# Patient Record
Sex: Male | Born: 1970 | Race: Black or African American | Hispanic: No | Marital: Single | Smoking: Never smoker
Health system: Southern US, Community
[De-identification: ages and names within clinical notes are randomized; demographics above are authoritative.]

## PROBLEM LIST (undated history)

## (undated) DIAGNOSIS — I10 Essential (primary) hypertension: Secondary | ICD-10-CM

## (undated) DIAGNOSIS — E785 Hyperlipidemia, unspecified: Secondary | ICD-10-CM

## (undated) DIAGNOSIS — E669 Obesity, unspecified: Secondary | ICD-10-CM

## (undated) DIAGNOSIS — I2510 Atherosclerotic heart disease of native coronary artery without angina pectoris: Secondary | ICD-10-CM

## (undated) DIAGNOSIS — E119 Type 2 diabetes mellitus without complications: Secondary | ICD-10-CM

## (undated) DIAGNOSIS — K5792 Diverticulitis of intestine, part unspecified, without perforation or abscess without bleeding: Secondary | ICD-10-CM

## (undated) DIAGNOSIS — K219 Gastro-esophageal reflux disease without esophagitis: Secondary | ICD-10-CM

## (undated) HISTORY — PX: ROTATOR CUFF REPAIR: SHX139

## (undated) HISTORY — PX: OTHER SURGICAL HISTORY: SHX169

## (undated) HISTORY — PX: COSMETIC SURGERY: SHX468

---

## 2011-02-21 ENCOUNTER — Emergency Department (INDEPENDENT_AMBULATORY_CARE_PROVIDER_SITE_OTHER): Payer: BC Managed Care – PPO

## 2011-02-21 ENCOUNTER — Emergency Department (HOSPITAL_BASED_OUTPATIENT_CLINIC_OR_DEPARTMENT_OTHER)
Admission: EM | Admit: 2011-02-21 | Discharge: 2011-02-21 | Disposition: A | Discharge status: Home / Self Care | Payer: BC Managed Care – PPO | Attending: Emergency Medicine | Admitting: Emergency Medicine

## 2011-02-21 DIAGNOSIS — M79609 Pain in unspecified limb: Secondary | ICD-10-CM

## 2011-02-21 DIAGNOSIS — M25549 Pain in joints of unspecified hand: Secondary | ICD-10-CM

## 2011-02-21 DIAGNOSIS — S52599A Other fractures of lower end of unspecified radius, initial encounter for closed fracture: Secondary | ICD-10-CM

## 2011-02-21 DIAGNOSIS — W010XXA Fall on same level from slipping, tripping and stumbling without subsequent striking against object, initial encounter: Secondary | ICD-10-CM

## 2011-02-21 DIAGNOSIS — M25539 Pain in unspecified wrist: Secondary | ICD-10-CM

## 2011-02-21 DIAGNOSIS — M25579 Pain in unspecified ankle and joints of unspecified foot: Secondary | ICD-10-CM | POA: Insufficient documentation

## 2011-02-21 DIAGNOSIS — W19XXXA Unspecified fall, initial encounter: Secondary | ICD-10-CM | POA: Insufficient documentation

## 2011-02-21 HISTORY — DX: Obesity, unspecified: E66.9

## 2011-02-21 HISTORY — DX: Gastro-esophageal reflux disease without esophagitis: K21.9

## 2011-02-21 HISTORY — DX: Essential (primary) hypertension: I10

## 2011-02-21 NOTE — ED Notes (Signed)
Pt states that he fell tonight at home and that he injured his R ankle and R hand.  Pt c/o pain in the R hand at the 4th and 5th digits

## 2011-09-08 ENCOUNTER — Other Ambulatory Visit: Payer: Self-pay

## 2011-09-08 ENCOUNTER — Emergency Department (HOSPITAL_BASED_OUTPATIENT_CLINIC_OR_DEPARTMENT_OTHER)
Admission: EM | Admit: 2011-09-08 | Discharge: 2011-09-09 | Disposition: A | Discharge status: Home / Self Care | Payer: BC Managed Care – PPO | Attending: Emergency Medicine | Admitting: Emergency Medicine

## 2011-09-08 ENCOUNTER — Emergency Department (INDEPENDENT_AMBULATORY_CARE_PROVIDER_SITE_OTHER): Payer: BC Managed Care – PPO

## 2011-09-08 ENCOUNTER — Encounter (HOSPITAL_BASED_OUTPATIENT_CLINIC_OR_DEPARTMENT_OTHER): Payer: Self-pay | Admitting: Emergency Medicine

## 2011-09-08 DIAGNOSIS — R141 Gas pain: Secondary | ICD-10-CM

## 2011-09-08 DIAGNOSIS — K219 Gastro-esophageal reflux disease without esophagitis: Secondary | ICD-10-CM | POA: Insufficient documentation

## 2011-09-08 DIAGNOSIS — R142 Eructation: Secondary | ICD-10-CM | POA: Insufficient documentation

## 2011-09-08 DIAGNOSIS — E669 Obesity, unspecified: Secondary | ICD-10-CM | POA: Insufficient documentation

## 2011-09-08 DIAGNOSIS — E785 Hyperlipidemia, unspecified: Secondary | ICD-10-CM | POA: Insufficient documentation

## 2011-09-08 DIAGNOSIS — K2970 Gastritis, unspecified, without bleeding: Secondary | ICD-10-CM

## 2011-09-08 DIAGNOSIS — R079 Chest pain, unspecified: Secondary | ICD-10-CM

## 2011-09-08 DIAGNOSIS — I10 Essential (primary) hypertension: Secondary | ICD-10-CM | POA: Insufficient documentation

## 2011-09-08 DIAGNOSIS — R110 Nausea: Secondary | ICD-10-CM | POA: Insufficient documentation

## 2011-09-08 DIAGNOSIS — Z79899 Other long term (current) drug therapy: Secondary | ICD-10-CM | POA: Insufficient documentation

## 2011-09-08 DIAGNOSIS — R109 Unspecified abdominal pain: Secondary | ICD-10-CM | POA: Insufficient documentation

## 2011-09-08 HISTORY — DX: Hyperlipidemia, unspecified: E78.5

## 2011-09-08 LAB — DIFFERENTIAL
Basophils Absolute: 0 10*3/uL (ref 0.0–0.1)
Basophils Relative: 0 % (ref 0–1)
Lymphocytes Relative: 51 % — ABNORMAL HIGH (ref 12–46)
Neutro Abs: 2.8 10*3/uL (ref 1.7–7.7)
Neutrophils Relative %: 39 % — ABNORMAL LOW (ref 43–77)

## 2011-09-08 LAB — CBC
MCHC: 34 g/dL (ref 30.0–36.0)
RDW: 12.1 % (ref 11.5–15.5)
WBC: 7.1 10*3/uL (ref 4.0–10.5)

## 2011-09-08 LAB — HEPATIC FUNCTION PANEL
AST: 29 U/L (ref 0–37)
Albumin: 4 g/dL (ref 3.5–5.2)
Bilirubin, Direct: <0.1 mg/dL (ref 0.0–0.3)
Total Protein: 8.5 g/dL — ABNORMAL HIGH (ref 6.0–8.3)

## 2011-09-08 LAB — BASIC METABOLIC PANEL
Chloride: 93 mEq/L — ABNORMAL LOW (ref 96–112)
GFR calc Af Amer: >90 mL/min (ref >90)
Potassium: 3.7 mEq/L (ref 3.5–5.1)
Sodium: 133 mEq/L — ABNORMAL LOW (ref 135–145)

## 2011-09-08 LAB — TROPONIN I: Troponin I: <0.3 ng/mL (ref <0.30)

## 2011-09-08 MED ORDER — METOCLOPRAMIDE HCL 5 MG/ML IJ SOLN: 10.0000 mg | Freq: Once | INTRAMUSCULAR | Status: DC

## 2011-09-08 MED ORDER — METOCLOPRAMIDE HCL 5 MG/ML IJ SOLN
10.0000 mg | Freq: Once | INTRAMUSCULAR | Status: AC
  Administered 2011-09-09: 10 mg via INTRAMUSCULAR
  Filled 2011-09-08: qty 2

## 2011-09-08 MED ORDER — GI COCKTAIL ~~LOC~~
30.0000 mL | Freq: Once | ORAL | Status: AC
  Administered 2011-09-08: 30 mL via ORAL
  Filled 2011-09-08: qty 30

## 2011-09-08 MED ORDER — METOCLOPRAMIDE HCL 10 MG PO TABS: 10.0000 mg | ORAL_TABLET | Freq: Four times a day (QID) | ORAL | Status: AC

## 2011-09-08 NOTE — ED Notes (Signed)
Pt updated on POC and is waiting to speak with EDP

## 2011-09-08 NOTE — ED Notes (Signed)
States he has been belching constantly x 3 days. Able to drink water and eat food without feeling of obstruction. Hx of having his esophagus stretched 7 years ago.

## 2011-09-08 NOTE — ED Provider Notes (Signed)
History     CSN: [id number]  Arrival date & time 09/08/11  1955   First MD Initiated Contact with Patient 09/08/11 2124      Chief Complaint  Patient presents with  . Nausea  . GI Problem  . Abdominal Pain    (Consider location/radiation/quality/duration/timing/severity/associated sxs/prior treatment) HPI Comments: Pt states that he has been persistently belching for 4 days:pt states that he had cp for a couple of hours 3 days ago, but has had no pain since:pt states that he is having nausea:pt denies abdominal or cp at this time  Patient is a 41 y.o. male presenting with GI illlness. The history is provided by the patient. No language interpreter was used.  GI Problem  This is a new problem. The current episode started more than 2 days ago. The problem occurs continuously. The problem has not changed since onset.There has been no fever. Pertinent negatives include no abdominal pain, no vomiting and no cough. He has tried nothing for the symptoms.    Past Medical History  Diagnosis Date  . Obesity   . Hypertension   . GERD (gastroesophageal reflux disease)   . Hyperlipemia     Past Surgical History  Procedure Date  . Cosmetic eye surgery     No family history on file.  History  Substance Use Topics  . Smoking status: Never Smoker   . Smokeless tobacco: Not on file  . Alcohol Use: No      Review of Systems  Respiratory: Negative for cough.   Gastrointestinal: Negative for vomiting and abdominal pain.  All other systems reviewed and are negative.    Allergies  Review of patient's allergies indicates no known allergies.  Home Medications   Current Outpatient Rx  Name Route Sig Dispense Refill  . ESOMEPRAZOLE MAGNESIUM 40 MG PO CPDR Oral Take 40 mg by mouth daily before breakfast.      . LISINOPRIL-HYDROCHLOROTHIAZIDE 20-12.5 MG PO TABS Oral Take 1 tablet by mouth daily.    Marland Kitchen LOVASTATIN 40 MG PO TABS Oral Take 40 mg by mouth at bedtime.    Marland Kitchen METFORMIN HCL  500 MG PO TABS Oral Take 500 mg by mouth daily.    Marland Kitchen METOPROLOL TARTRATE 100 MG PO TABS Oral Take 200 mg by mouth daily.    Marland Kitchen RANITIDINE HCL 150 MG PO TABS Oral Take 150 mg by mouth once.      BP 138/110  Pulse 108  Temp(Src) 98.4 F (36.9 C) (Oral)  Resp 18  SpO2 97%  Physical Exam  Nursing note reviewed. Constitutional: He is oriented to person, place, and time. He appears well-developed and well-nourished. He appears distressed.  HENT:  Head: Normocephalic and atraumatic.  Eyes: EOM are normal.  Neck: Neck supple.  Cardiovascular: Normal rate and regular rhythm.   Pulmonary/Chest: Effort normal and breath sounds normal.  Abdominal: Soft. Bowel sounds are normal. There is no tenderness.  Musculoskeletal: Normal range of motion.  Neurological: He is alert and oriented to person, place, and time.  Skin: Skin is warm. He is diaphoretic.  Psychiatric: He has a normal mood and affect.    ED Course  Procedures (including critical care time)   Labs Reviewed  CBC  DIFFERENTIAL  BASIC METABOLIC PANEL  TROPONIN I   No results found.   No diagnosis found.    MDM   Date: 09/08/2011  Rate: 106  Rhythm: sinus tachycardia  QRS Axis: normal  Intervals: normal  ST/T Wave abnormalities: nonspecific ST changes  Conduction Disutrbances:none  Narrative Interpretation:   Old EKG Reviewed: none available  Results for orders placed during the hospital encounter of 09/08/11  CBC      Component Value Range   WBC 7.1  4.0 - 10.5 (K/uL)   RBC 4.85  4.22 - 5.81 (MIL/uL)   Hemoglobin 14.6  13.0 - 17.0 (g/dL)   HCT 16.1  09.6 - 04.5 (%)   MCV 88.5  78.0 - 100.0 (fL)   MCH 30.1  26.0 - 34.0 (pg)   MCHC 34.0  30.0 - 36.0 (g/dL)   RDW 40.9  81.1 - 91.4 (%)   Platelets 227  150 - 400 (K/uL)  DIFFERENTIAL      Component Value Range   Neutrophils Relative 39 (*) 43 - 77 (%)   Neutro Abs 2.8  1.7 - 7.7 (K/uL)   Lymphocytes Relative 51 (*) 12 - 46 (%)   Lymphs Abs 3.6  0.7 - 4.0  (K/uL)   Monocytes Relative 9  3 - 12 (%)   Monocytes Absolute 0.6  0.1 - 1.0 (K/uL)   Eosinophils Relative 2  0 - 5 (%)   Eosinophils Absolute 0.1  0.0 - 0.7 (K/uL)   Basophils Relative 0  0 - 1 (%)   Basophils Absolute 0.0  0.0 - 0.1 (K/uL)  BASIC METABOLIC PANEL      Component Value Range   Sodium 133 (*) 135 - 145 (mEq/L)   Potassium 3.7  3.5 - 5.1 (mEq/L)   Chloride 93 (*) 96 - 112 (mEq/L)   CO2 22  19 - 32 (mEq/L)   Glucose, Bld 296 (*) 70 - 99 (mg/dL)   BUN 12  6 - 23 (mg/dL)   Creatinine, Ser 7.82  0.50 - 1.35 (mg/dL)   Calcium 9.9  8.4 - 95.6 (mg/dL)   GFR calc non Af Amer >90  >90 (mL/min)   GFR calc Af Amer >90  >90 (mL/min)  TROPONIN I      Component Value Range   Troponin I <0.30  <0.30 (ng/mL)  HEPATIC FUNCTION PANEL      Component Value Range   Total Protein 8.5 (*) 6.0 - 8.3 (g/dL)   Albumin 4.0  3.5 - 5.2 (g/dL)   AST 29  0 - 37 (U/L)   ALT 24  0 - 53 (U/L)   Alkaline Phosphatase 56  39 - 117 (U/L)   Total Bilirubin 0.3  0.3 - 1.2 (mg/dL)   Bilirubin, Direct <2.1  0.0 - 0.3 (mg/dL)   Indirect Bilirubin NOT CALCULATED  0.3 - 0.9 (mg/dL)   Dg Chest 2 View  10/22/6576  *RADIOLOGY REPORT*  Clinical Data: Uncontrollable belching; mid anterior chest pain.  CHEST - 2 VIEW  Comparison: None.  Findings: The lungs are well-aerated and clear.  There is no evidence of focal opacification, pleural effusion or pneumothorax. Pulmonary vascularity is at the upper limits of normal.  The heart is normal in size; the mediastinal contour is within normal limits.  No acute osseous abnormalities are seen.  IMPRESSION: No acute cardiopulmonary process seen.  Original Report Authenticated By: Tonia Ghent, M.D.    11:27 PM Patient is reassessed in the room.  Diagnostic studies are reviewed. Normal liver function tests, normal bilirubin. Electrolytes essentially normal. CBC is normal. Chest x-ray is normal. Troponin is negative.  Give Reglan IV for his persistent burping. Other  studies are normal. Patient is in no acute distress. He will likely need further evaluation as an outpatient by a GI  specialist, will refer to Dr. Madilyn Fireman. Patient was told to return to the ED for any concerns or changing symptoms          Hayat Warbington A. Patrica Duel, MD 09/08/11 2337

## 2011-09-08 NOTE — ED Notes (Signed)
Pt c/o generalized abd pain with nausea and excessive burping since sat.

## 2014-12-04 ENCOUNTER — Emergency Department (HOSPITAL_BASED_OUTPATIENT_CLINIC_OR_DEPARTMENT_OTHER): Payer: BC Managed Care – PPO

## 2014-12-04 ENCOUNTER — Emergency Department (HOSPITAL_BASED_OUTPATIENT_CLINIC_OR_DEPARTMENT_OTHER)
Admission: EM | Admit: 2014-12-04 | Discharge: 2014-12-04 | Disposition: A | Discharge status: Home / Self Care | Payer: BC Managed Care – PPO | Attending: Emergency Medicine | Admitting: Emergency Medicine

## 2014-12-04 ENCOUNTER — Encounter (HOSPITAL_BASED_OUTPATIENT_CLINIC_OR_DEPARTMENT_OTHER): Payer: Self-pay

## 2014-12-04 DIAGNOSIS — R739 Hyperglycemia, unspecified: Secondary | ICD-10-CM

## 2014-12-04 DIAGNOSIS — K219 Gastro-esophageal reflux disease without esophagitis: Secondary | ICD-10-CM | POA: Diagnosis not present

## 2014-12-04 DIAGNOSIS — E1165 Type 2 diabetes mellitus with hyperglycemia: Secondary | ICD-10-CM | POA: Diagnosis not present

## 2014-12-04 DIAGNOSIS — R1012 Left upper quadrant pain: Secondary | ICD-10-CM

## 2014-12-04 DIAGNOSIS — K5792 Diverticulitis of intestine, part unspecified, without perforation or abscess without bleeding: Secondary | ICD-10-CM | POA: Insufficient documentation

## 2014-12-04 DIAGNOSIS — I10 Essential (primary) hypertension: Secondary | ICD-10-CM | POA: Insufficient documentation

## 2014-12-04 DIAGNOSIS — Z79899 Other long term (current) drug therapy: Secondary | ICD-10-CM | POA: Insufficient documentation

## 2014-12-04 DIAGNOSIS — R1032 Left lower quadrant pain: Secondary | ICD-10-CM | POA: Diagnosis present

## 2014-12-04 DIAGNOSIS — E785 Hyperlipidemia, unspecified: Secondary | ICD-10-CM | POA: Insufficient documentation

## 2014-12-04 DIAGNOSIS — E669 Obesity, unspecified: Secondary | ICD-10-CM | POA: Diagnosis not present

## 2014-12-04 HISTORY — DX: Diverticulitis of intestine, part unspecified, without perforation or abscess without bleeding: K57.92

## 2014-12-04 HISTORY — DX: Type 2 diabetes mellitus without complications: E11.9

## 2014-12-04 LAB — CBC WITH DIFFERENTIAL/PLATELET
BASOS PCT: 0 % (ref 0–1)
Basophils Absolute: 0 10*3/uL (ref 0.0–0.1)
EOS ABS: 0.1 10*3/uL (ref 0.0–0.7)
EOS PCT: 1 % (ref 0–5)
HEMATOCRIT: 39.5 % (ref 39.0–52.0)
Hemoglobin: 13.7 g/dL (ref 13.0–17.0)
Lymphocytes Relative: 32 % (ref 12–46)
Lymphs Abs: 2.5 10*3/uL (ref 0.7–4.0)
MCH: 30.2 pg (ref 26.0–34.0)
MCHC: 34.7 g/dL (ref 30.0–36.0)
MCV: 87.2 fL (ref 78.0–100.0)
MONO ABS: 0.5 10*3/uL (ref 0.1–1.0)
MONOS PCT: 7 % (ref 3–12)
NEUTROS PCT: 60 % (ref 43–77)
Neutro Abs: 4.6 10*3/uL (ref 1.7–7.7)
Platelets: 230 10*3/uL (ref 150–400)
RBC: 4.53 MIL/uL (ref 4.22–5.81)
RDW: 11.9 % (ref 11.5–15.5)
WBC: 7.8 10*3/uL (ref 4.0–10.5)

## 2014-12-04 LAB — COMPREHENSIVE METABOLIC PANEL
ALT: 13 U/L (ref 0–53)
AST: 14 U/L (ref 0–37)
Albumin: 3.5 g/dL (ref 3.5–5.2)
Alkaline Phosphatase: 56 U/L (ref 39–117)
Anion gap: 9 (ref 5–15)
BUN: 10 mg/dL (ref 6–23)
CALCIUM: 8.6 mg/dL (ref 8.4–10.5)
CHLORIDE: 94 mmol/L — AB (ref 96–112)
CO2: 26 mmol/L (ref 19–32)
CREATININE: 1.17 mg/dL (ref 0.50–1.35)
GFR, EST AFRICAN AMERICAN: 87 mL/min — AB (ref >90)
GFR, EST NON AFRICAN AMERICAN: 75 mL/min — AB (ref >90)
Glucose, Bld: 538 mg/dL — ABNORMAL HIGH (ref 70–99)
POTASSIUM: 4.2 mmol/L (ref 3.5–5.1)
SODIUM: 129 mmol/L — AB (ref 135–145)
TOTAL PROTEIN: 7.6 g/dL (ref 6.0–8.3)
Total Bilirubin: 0.4 mg/dL (ref 0.3–1.2)

## 2014-12-04 LAB — URINALYSIS, ROUTINE W REFLEX MICROSCOPIC
BILIRUBIN URINE: NEGATIVE
Glucose, UA: >1000 mg/dL — AB
HGB URINE DIPSTICK: NEGATIVE
KETONES UR: NEGATIVE mg/dL
Leukocytes, UA: NEGATIVE
Nitrite: NEGATIVE
PROTEIN: NEGATIVE mg/dL
Specific Gravity, Urine: 1.031 — ABNORMAL HIGH (ref 1.005–1.030)
UROBILINOGEN UA: 0.2 mg/dL (ref 0.0–1.0)
pH: 6 (ref 5.0–8.0)

## 2014-12-04 LAB — LIPASE, BLOOD: LIPASE: 39 U/L (ref 11–59)

## 2014-12-04 LAB — URINE MICROSCOPIC-ADD ON

## 2014-12-04 LAB — CBG MONITORING, ED
GLUCOSE-CAPILLARY: 315 mg/dL — AB (ref 70–99)
Glucose-Capillary: 393 mg/dL — ABNORMAL HIGH (ref 70–99)

## 2014-12-04 MED ORDER — IOHEXOL 300 MG/ML  SOLN
100.0000 mL | Freq: Once | INTRAMUSCULAR | Status: AC | PRN
  Administered 2014-12-04: 100 mL via INTRAVENOUS

## 2014-12-04 MED ORDER — SODIUM CHLORIDE 0.9 % IV BOLUS (SEPSIS)
1000.0000 mL | Freq: Once | INTRAVENOUS | Status: AC
  Administered 2014-12-04: 1000 mL via INTRAVENOUS

## 2014-12-04 MED ORDER — HYDROCODONE-ACETAMINOPHEN 5-325 MG PO TABS: 1.0000 | ORAL_TABLET | Freq: Four times a day (QID) | ORAL | Status: DC | PRN

## 2014-12-04 MED ORDER — METRONIDAZOLE 500 MG PO TABS: 500.0000 mg | ORAL_TABLET | Freq: Three times a day (TID) | ORAL | Status: DC

## 2014-12-04 MED ORDER — CIPROFLOXACIN HCL 500 MG PO TABS
500.0000 mg | ORAL_TABLET | Freq: Once | ORAL | Status: AC
  Administered 2014-12-04: 500 mg via ORAL
  Filled 2014-12-04: qty 1

## 2014-12-04 MED ORDER — IOHEXOL 300 MG/ML  SOLN
50.0000 mL | Freq: Once | INTRAMUSCULAR | Status: AC | PRN
  Administered 2014-12-04: 50 mL via ORAL

## 2014-12-04 MED ORDER — METRONIDAZOLE 500 MG PO TABS
500.0000 mg | ORAL_TABLET | Freq: Once | ORAL | Status: AC
  Administered 2014-12-04: 500 mg via ORAL
  Filled 2014-12-04: qty 1

## 2014-12-04 MED ORDER — CIPROFLOXACIN HCL 500 MG PO TABS: 500.0000 mg | ORAL_TABLET | Freq: Two times a day (BID) | ORAL | Status: DC

## 2014-12-04 NOTE — ED Provider Notes (Signed)
CSN: [id number]     Arrival date & time 12/04/14  1406 History   First MD Initiated Contact with Patient 12/04/14 1504     Chief Complaint  Patient presents with  . Abdominal Pain     (Consider location/radiation/quality/duration/timing/severity/associated sxs/prior Treatment) The history is provided by the patient. No language interpreter was used.  Lindley Hiney is a 44 year old male with past medical history of obesity, diabetes, hypertension, GERD, diverticulitis presenting to the ED with abdominal pain that has been ongoing for approximately one week. Patient reports that the pain is localized to left lower quadrant with radiation to the right lower quadrant describes a throbbing sensation that comes and goes worse with motion. Patient reports he's been having nausea without episodes of emesis. Reported mildly loose stools with no mucus or blood identified. Reported increased urine frequency. Stated that he has not taking anything for the pain. Reported that he's been drinking fluids and staying hydrated. Patient reported that he has history of diverticulitis, last year in the year before that. Patient reported that he had a colonoscopy performed approximate 2 years ago that identified diverticulosis. Denied fever, chills, neck pain, neck stiffness, chest pain, shortness of breath, difficulty breathing, vomiting, diarrhea, melena, hematochezia, hematuria, dysuria. PCP none  Past Medical History  Diagnosis Date  . Obesity   . Hypertension   . GERD (gastroesophageal reflux disease)   . Hyperlipemia   . Diverticulitis   . Diabetes mellitus without complication    Past Surgical History  Procedure Laterality Date  . Cosmetic eye surgery    . Cosmetic surgery    . Nipple surgery     No family history on file. History  Substance Use Topics  . Smoking status: Never Smoker   . Smokeless tobacco: Not on file  . Alcohol Use: No    Review of Systems  Constitutional: Negative  for fever and chills.  Eyes: Negative for visual disturbance.  Respiratory: Negative for chest tightness and shortness of breath.   Cardiovascular: Negative for chest pain.  Gastrointestinal: Positive for nausea, abdominal pain and diarrhea. Negative for vomiting, constipation, blood in stool and anal bleeding.  Genitourinary: Positive for frequency. Negative for dysuria, hematuria and decreased urine volume.  Musculoskeletal: Negative for back pain, neck pain and neck stiffness.  Neurological: Negative for dizziness, weakness and headaches.      Allergies  Review of patient's allergies indicates no known allergies.  Home Medications   Prior to Admission medications   Medication Sig Start Date End Date Taking? Authorizing Provider  LOSARTAN POTASSIUM-HCTZ PO Take by mouth.   Yes Historical Provider, MD  ciprofloxacin (CIPRO) 500 MG tablet Take 1 tablet (500 mg total) by mouth 2 (two) times daily. 12/04/14   Geriann Lafont, PA-C  esomeprazole (NEXIUM) 40 MG capsule Take 40 mg by mouth daily before breakfast.      Historical Provider, MD  HYDROcodone-acetaminophen (NORCO/VICODIN) 5-325 MG per tablet Take 1 tablet by mouth every 6 (six) hours as needed for severe pain. 12/04/14   Deneen Slager, PA-C  lovastatin (MEVACOR) 40 MG tablet Take 40 mg by mouth at bedtime.    Historical Provider, MD  metFORMIN (GLUCOPHAGE) 500 MG tablet Take 500 mg by mouth daily.    Historical Provider, MD  metroNIDAZOLE (FLAGYL) 500 MG tablet Take 1 tablet (500 mg total) by mouth 3 (three) times daily. 12/04/14   Zvi Duplantis, PA-C   BP 134/82 mmHg  Pulse 86  Temp(Src) 98.2 F (36.8 C) (Oral)  Resp 18  Ht 6\' 1"  (1.854 m)  Wt 309 lb (140.161 kg)  BMI 40.78 kg/m2  SpO2 94% Physical Exam  Constitutional: He is oriented to person, place, and time. He appears well-developed and well-nourished. No distress.  HENT:  Head: Normocephalic and atraumatic.  Mouth/Throat: Oropharynx is clear and moist. No  oropharyngeal exudate.  Eyes: Conjunctivae and EOM are normal. Right eye exhibits no discharge. Left eye exhibits no discharge.  Neck: Normal range of motion. Neck supple.  Cardiovascular: Normal rate, regular rhythm and normal heart sounds.  Exam reveals no friction rub.   No murmur heard. Pulmonary/Chest: Effort normal and breath sounds normal. No respiratory distress. He has no wheezes. He has no rales.  Abdominal: Soft. Bowel sounds are normal. He exhibits no distension. There is tenderness in the right lower quadrant and left lower quadrant. There is no rebound and no guarding.  Obese  Musculoskeletal: Normal range of motion.  Neurological: He is alert and oriented to person, place, and time. No cranial nerve deficit. He exhibits normal muscle tone. Coordination normal.  Skin: Skin is warm. No rash noted. He is not diaphoretic. No erythema.  Psychiatric: He has a normal mood and affect. His behavior is normal. Thought content normal.  Nursing note and vitals reviewed.   ED Course  Procedures (including critical care time)  Results for orders placed or performed during the hospital encounter of 12/04/14  CBC with Differential/Platelet  Result Value Ref Range   WBC 7.8 4.0 - 10.5 K/uL   RBC 4.53 4.22 - 5.81 MIL/uL   Hemoglobin 13.7 13.0 - 17.0 g/dL   HCT 45.4 09.8 - 11.9 %   MCV 87.2 78.0 - 100.0 fL   MCH 30.2 26.0 - 34.0 pg   MCHC 34.7 30.0 - 36.0 g/dL   RDW 14.7 82.9 - 56.2 %   Platelets 230 150 - 400 K/uL   Neutrophils Relative % 60 43 - 77 %   Neutro Abs 4.6 1.7 - 7.7 K/uL   Lymphocytes Relative 32 12 - 46 %   Lymphs Abs 2.5 0.7 - 4.0 K/uL   Monocytes Relative 7 3 - 12 %   Monocytes Absolute 0.5 0.1 - 1.0 K/uL   Eosinophils Relative 1 0 - 5 %   Eosinophils Absolute 0.1 0.0 - 0.7 K/uL   Basophils Relative 0 0 - 1 %   Basophils Absolute 0.0 0.0 - 0.1 K/uL  Comprehensive metabolic panel  Result Value Ref Range   Sodium 129 (L) 135 - 145 mmol/L   Potassium 4.2 3.5 - 5.1  mmol/L   Chloride 94 (L) 96 - 112 mmol/L   CO2 26 19 - 32 mmol/L   Glucose, Bld 538 (H) 70 - 99 mg/dL   BUN 10 6 - 23 mg/dL   Creatinine, Ser 1.30 0.50 - 1.35 mg/dL   Calcium 8.6 8.4 - 86.5 mg/dL   Total Protein 7.6 6.0 - 8.3 g/dL   Albumin 3.5 3.5 - 5.2 g/dL   AST 14 0 - 37 U/L   ALT 13 0 - 53 U/L   Alkaline Phosphatase 56 39 - 117 U/L   Total Bilirubin 0.4 0.3 - 1.2 mg/dL   GFR calc non Af Amer 75 (L) >90 mL/min   GFR calc Af Amer 87 (L) >90 mL/min   Anion gap 9 5 - 15  Lipase, blood  Result Value Ref Range   Lipase 39 11 - 59 U/L  Urinalysis, Routine w reflex microscopic  Result Value Ref Range   Color,  Urine YELLOW YELLOW   APPearance CLEAR CLEAR   Specific Gravity, Urine 1.031 (H) 1.005 - 1.030   pH 6.0 5.0 - 8.0   Glucose, UA >1000 (A) NEGATIVE mg/dL   Hgb urine dipstick NEGATIVE NEGATIVE   Bilirubin Urine NEGATIVE NEGATIVE   Ketones, ur NEGATIVE NEGATIVE mg/dL   Protein, ur NEGATIVE NEGATIVE mg/dL   Urobilinogen, UA 0.2 0.0 - 1.0 mg/dL   Nitrite NEGATIVE NEGATIVE   Leukocytes, UA NEGATIVE NEGATIVE  Urine microscopic-add on  Result Value Ref Range   Squamous Epithelial / LPF RARE RARE   Bacteria, UA RARE RARE  CBG monitoring, ED  Result Value Ref Range   Glucose-Capillary 393 (H) 70 - 99 mg/dL  CBG monitoring, ED  Result Value Ref Range   Glucose-Capillary 315 (H) 70 - 99 mg/dL    Labs Review Labs Reviewed  COMPREHENSIVE METABOLIC PANEL - Abnormal; Notable for the following:    Sodium 129 (*)    Chloride 94 (*)    Glucose, Bld 538 (*)    GFR calc non Af Amer 75 (*)    GFR calc Af Amer 87 (*)    All other components within normal limits  URINALYSIS, ROUTINE W REFLEX MICROSCOPIC - Abnormal; Notable for the following:    Specific Gravity, Urine 1.031 (*)    Glucose, UA >1000 (*)    All other components within normal limits  CBG MONITORING, ED - Abnormal; Notable for the following:    Glucose-Capillary 393 (*)    All other components within normal limits   CBG MONITORING, ED - Abnormal; Notable for the following:    Glucose-Capillary 315 (*)    All other components within normal limits  CBC WITH DIFFERENTIAL/PLATELET  LIPASE, BLOOD  URINE MICROSCOPIC-ADD ON    Imaging Review Ct Abdomen Pelvis W Contrast  12/04/2014   CLINICAL DATA:  Lower abdominal pain, 1 week duration. Nausea. History of diverticulitis.  EXAM: CT ABDOMEN AND PELVIS WITH CONTRAST  TECHNIQUE: Multidetector CT imaging of the abdomen and pelvis was performed using the standard protocol following bolus administration of intravenous contrast.  CONTRAST:  50mL OMNIPAQUE IOHEXOL 300 MG/ML SOLN, 100mL OMNIPAQUE IOHEXOL 300 MG/ML SOLN  COMPARISON:  01/13/2014  FINDINGS: Lung bases are clear.  No pleural or pericardial fluid.  There is diffuse fatty change of the liver. No calcified gallstones. The spleen is normal. The pancreas is normal. The adrenal glands are normal. The kidneys are normal. No cyst, mass, stone or hydronephrosis. The aorta and IVC are normal. No retroperitoneal mass or adenopathy.  The patient has extensive diverticulosis of the colon. There is considerable stranding of the fat adjacent to the descending sigmoid junction consistent with acute diverticulitis. No evidence of drainable abscess or free air. No fluid in the pelvis. Bladder, prostate gland and seminal vesicles are normal.  IMPRESSION: Acute diverticulitis at the descending sigmoid junction. Inflammatory stranding in the region but no drainable abscess at this time.  Fatty liver   Electronically Signed   By: Paulina FusiMark  Shogry M.D.   On: 12/04/2014 19:22     EKG Interpretation None       8:53 PM Patient reassessed. Patient reports that he is feeling better. Patient tolerated antibiotics without difficulty. Negative episodes of emesis in ED setting. Patient reports that he is ready to be discharged home.  MDM   Final diagnoses:  Acute diverticulitis  Left upper quadrant pain  Hyperglycemia    Medications   sodium chloride 0.9 % bolus 1,000 mL (0 mLs Intravenous  Stopped 12/04/14 1632)  iohexol (OMNIPAQUE) 300 MG/ML solution 50 mL (50 mLs Oral Contrast Given 12/04/14 1611)  sodium chloride 0.9 % bolus 1,000 mL (0 mLs Intravenous Stopped 12/04/14 1908)  iohexol (OMNIPAQUE) 300 MG/ML solution 100 mL (100 mLs Intravenous Contrast Given 12/04/14 1902)  ciprofloxacin (CIPRO) tablet 500 mg (500 mg Oral Given 12/04/14 1941)  metroNIDAZOLE (FLAGYL) tablet 500 mg (500 mg Oral Given 12/04/14 1942)    Filed Vitals:   12/04/14 1410 12/04/14 1615 12/04/14 1740 12/04/14 1946  BP: 132/91 124/88 127/78 134/82  Pulse: 116 112 88 86  Temp: 98.2 F (36.8 C)     TempSrc: Oral     Resp: Height:  (1.854 m)     Weight: 309 lb (140.161 kg)     SpO2: 94% 97% 97% 94%   CBC negative elevated leukocytosis. Hemoglobin 13.7, hematocrit 39.5. CMP noted mildly low sodium of 129 with a chloride of 94. Glucose of 538 with negative elevated anion gap-9.0 mEq per liter. Lipase negative elevation. Urinalysis negative for hemoglobin, nitrites, leukocytes- elevated specific gravity of 1.031. Negative ketones noted in urine. CT abdomen and pelvis with contrast identified acute diverticulitis at the descending sigmoid junction-inflammatory stranding in the region but no drainable abscess at this time. Patient given IV fluids in ED setting. After 1 L of IV fluids sugar has decreased from 538 to 393. After 2 L of fluid patient's sugar decreased to 315-patient reported that he did not take his diabetic medications today. Doubt DKA - patient appeared to be mildly dehydrated. CT abdomen pelvis with contrast identified acute diverticulitis without findings of abscess. Patient given IV fluids in ED setting as well as antibiotics by mouth-negative episodes of emesis in ED setting. Patient stable, afebrile. Patient not septic appearing. Negative signs of respiratory distress. Discussed case, labs, and imaging in great detail with  attending physician who agrees to plan of discharge. Discharged patient. Discharge patient with antibiotics and pain medications. Discussed with patient to rest and stay hydrated. Discussed the patient clear liquid diet. [facility] and [facility]. Discussed with patient that sodium levels were a little bit low today, recommended sodium to be rechecked with an approximately 48 hours. Discussed with patient to closely monitor symptoms and if symptoms are to worsen or change to report back to the ED - strict return instructions given.  Patient agreed to plan of care, understood, all questions answered.   Raymon Mutton, PA-C 12/04/14 1610  Rolan Bucco, MD 12/05/14 [id number]

## 2014-12-04 NOTE — Discharge Instructions (Signed)
Please call your doctor for a followup appointment within 24-48 hours. When you talk to your doctor please let them know that you were seen in the emergency department and have them acquire all of your records so that they can discuss the findings with you and formulate a treatment plan to fully care for your new and ongoing problems. Please follow-up and set up an appointment with health and wellness center-[facility] needs to be rechecked with an approximately 24-48 hours Please follow-up with gastroenterology regarding recurrent episodes of diverticulitis Please take antibiotics as prescribed Please take medications as prescribed - while on pain medications there is to be no drinking alcohol, driving, operating any heavy machinery. If extra please dispose in a proper manner. Please do not take any extra Tylenol with this medication for this can lead to Tylenol overdose and liver issues.  Please stick with a clear liquid diet for bowel rest  Please continue to monitor sugars closely - sugar were high today  Please continue to monitor symptoms closely and if symptoms are to worsen or change (fever greater than 101, chills, sweating, nausea, vomiting, chest pain, shortness of breathe, difficulty breathing, weakness, numbness, tingling, worsening or changes to pain pattern, blood in the stools, black tarry stools, inability to keep food or fluids down, worsening or changes to stomach pain, back pain) please report back to the Emergency Department immediately.    Diverticulitis Diverticulitis is inflammation or infection of small pouches in your colon that form when you have a condition called diverticulosis. The pouches in your colon are called diverticula. Your colon, or large intestine, is where water is absorbed and stool is formed. Complications of diverticulitis can include:  Bleeding.  Severe infection.  Severe pain.  Perforation of your colon.  Obstruction of your colon. CAUSES   Diverticulitis is caused by bacteria. Diverticulitis happens when stool becomes trapped in diverticula. This allows bacteria to grow in the diverticula, which can lead to inflammation and infection. RISK FACTORS People with diverticulosis are at risk for diverticulitis. Eating a diet that does not include enough fiber from fruits and vegetables may make diverticulitis more likely to develop. SYMPTOMS  Symptoms of diverticulitis may include:  Abdominal pain and tenderness. The pain is normally located on the left side of the abdomen, but may occur in other areas.  Fever and chills.  Bloating.  Cramping.  Nausea.  Vomiting.  Constipation.  Diarrhea.  Blood in your stool. DIAGNOSIS  Your health care provider will ask you about your medical history and do a physical exam. You may need to have tests done because many medical conditions can cause the same symptoms as diverticulitis. Tests may include:  Blood tests.  Urine tests.  Imaging tests of the abdomen, including X-rays and CT scans. When your condition is under control, your health care provider may recommend that you have a colonoscopy. A colonoscopy can show how severe your diverticula are and whether something else is causing your symptoms. TREATMENT  Most cases of diverticulitis are mild and can be treated at home. Treatment may include:  Taking over-the-counter pain medicines.  Following a clear liquid diet.  Taking antibiotic medicines by mouth for 7-10 days. More severe cases may be treated at a hospital. Treatment may include:  Not eating or drinking.  Taking prescription pain medicine.  Receiving antibiotic medicines through an IV tube.  Receiving fluids and nutrition through an IV tube.  Surgery. HOME CARE INSTRUCTIONS   Follow your health care provider's instructions carefully.  Follow  a full liquid diet or other diet as directed by your health care provider. After your symptoms improve, your  health care provider may tell you to change your diet. He or she may recommend you eat a high-fiber diet. Fruits and vegetables are good sources of fiber. Fiber makes it easier to pass stool.  Take fiber supplements or probiotics as directed by your health care provider.  Only take medicines as directed by your health care provider.  Keep all your follow-up appointments. SEEK MEDICAL CARE IF:   Your pain does not improve.  You have a hard time eating food.  Your bowel movements do not return to normal. SEEK IMMEDIATE MEDICAL CARE IF:   Your pain becomes worse.  Your symptoms do not get better.  Your symptoms suddenly get worse.  You have a fever.  You have repeated vomiting.  You have bloody or black, tarry stools. MAKE SURE YOU:   Understand these instructions.  Will watch your condition.  Will get help right away if you are not doing well or get worse. Document Released: 05/13/2005 Document Revised: 08/08/2013 Document Reviewed: 06/28/2013 [facility] Patient Information 2015 [place], [place]. This information is not intended to replace advice given to you by your health care provider. Make sure you discuss any questions you have with your health care provider.  Clear Liquid Diet A clear liquid diet is a short-term diet that is prescribed to provide the necessary fluid and basic energy you need when you can have nothing else. The clear liquid diet consists of liquids or solids that will become liquid at room temperature. You should be able to see through the liquid. There are many reasons that you may be restricted to clear liquids, such as:  When you have a sudden-onset (acute) condition that occurs before or after surgery.  To help your body slowly get adjusted to food again after a long period when you were unable to have food.  Replacement of fluids when you have a diarrheal disease.  When you are going to have certain exams, such as a colonoscopy, in which instruments  are inserted inside your body to look at parts of your digestive system. WHAT CAN I HAVE? A clear liquid diet does not provide all the nutrients you need. It is important to choose a variety of the following items to get as many nutrients as possible:  Vegetable juices that do not have pulp.  Fruit juices and fruit drinks that do not have pulp.  Coffee (regular or decaffeinated), tea, or soda at the discretion of your health care provider.  Clear bouillon, broth, or strained broth-based soups.  High-protein and flavored gelatins.  Sugar or honey.  Ices or frozen ice pops that do not contain milk. If you are not sure whether you can have certain items, you should ask your health care provider. You may also ask your health care provider if there are any other clear liquid options. Document Released: 08/03/2005 Document Revised: 08/08/2013 Document Reviewed: 06/30/2013 [facility] Patient Information 2015 [place], [place]. This information is not intended to replace advice given to you by your health care provider. Make sure you discuss any questions you have with your health care provider.   Emergency Department Resource Guide 1) Find a Doctor and Pay Out of Pocket Although you won't have to find out who is covered by your insurance plan, it is a good idea to ask around and get recommendations. You will then need to call the office and see if the  doctor you have chosen will accept you as a new patient and what types of options they offer for patients who are self-pay. Some doctors offer discounts or will set up payment plans for their patients who do not have insurance, but you will need to ask so you aren't surprised when you get to your appointment.  2) Contact Your Local Health Department Not all health departments have doctors that can see patients for sick visits, but many do, so it is worth a call to see if yours does. If you don't know where your local health department is, you can check  in your phone book. The CDC also has a tool to help you locate your state's health department, and many state websites also have listings of all of their local health departments.  3) Find a Walk-in Clinic If your illness is not likely to be very severe or complicated, you may want to try a walk in clinic. These are popping up all over the country in pharmacies, drugstores, and shopping centers. They're usually staffed by nurse practitioners or physician assistants that have been trained to treat common illnesses and complaints. They're usually fairly quick and inexpensive. However, if you have serious medical issues or chronic medical problems, these are probably not your best option.  No Primary Care Doctor: - Call Health Connect at  [phone] - they can help you locate a primary care doctor that  accepts your insurance, provides certain services, etc. - Physician Referral Service- [phone]  Chronic Pain Problems: Organization         Address  Phone   Notes  [facility]  [phone] Patients need to be referred by their primary care doctor.   Medication Assistance: Organization         Address  Phone   Notes  [facility] Medication [facility] [address] [place], [place] [address] [phone] --Must be a resident of [facility] -- Must have NO insurance coverage whatsoever (no Medicaid/ Medicare, etc.) -- The pt. MUST have a primary care doctor that directs their care regularly and follows them in the community   MedAssist  [phone]   [employer]  [phone]    Agencies that provide inexpensive medical care: Organization         Address  Phone   Notes  [facility]  [phone]   [facility]    [phone]   [facility] [address] [place], [place] [address] [phone]   [facility] [address] [place], [place] [phone]    Planned Parenthood    [phone]   [facility]    [phone]   Community Health and [facility]  [address], [place] Phone:  [phone], Fax:  [phone] Hours of Operation:  9 am - 6 pm, M-F.  Also accepts Medicaid/Medicare and self-pay.  [facility]  [address], [place] Phone: [phone], Fax: [phone]. Hours of Operation:  8:30 am - 5:30 pm, M-F.  Also accepts Medicaid and self-pay.  [facility] [place] [address], [place] Phone: [phone]   [facility] [address] Natasha Bence [place], [place] [phone], Ext. [phone] Mondays & Thursdays: 7-9 AM.  First 15 patients are seen on a first come, first serve basis.    Medicaid-accepting [facility] Providers:  Organization  Address  Phone   Notes  [facility] [address], [place] [phone]) [phone] Also accepts self-pay patients.  [facility] [address], [place]  [phone]) [phone]   [facility] [address], [place] [phone](336) [phone]   [facility] [address] [place] [address], [place] [phone]   Renaye RakersVeita Bland [address], [place]   [phone] Only accepts [place] Access [place] patients after they have their name applied to their card.   Self-Pay (no insurance) in [facility] [place]:  Organization         Address  Phone   Notes  Sickle Cell Patients, [facility] [address], [place] [phone]) [phone]   [facility] [address], [place] [phone]   [facility]  [address], [place] [phone]   [facility]/Dr. Osei-Bonsu  [address] [place] [address], [place] or [phone], [place] [phone]) [phone] Phone number for both [place] and [place] locations is the  same.  [facility] [address], [place] [phone]   [facility] [place] [address] [place] [address], [place] or [address] [phone]) [phone]   [facility] [address], [place] [phone]) [phone], phone; [phone], fax Sees patients 1st and 3rd Saturday of every month.  Must not qualify for public or private insurance (i.e. Medicaid, Medicare, [place] Health Choice, Veterans' Benefits)  Household income should be no more than 200% of the poverty level The clinic cannot treat you if you are pregnant or think you are pregnant  Sexually transmitted diseases are not treated at the clinic.    Dental Care: Organization         Address  Phone  Notes  [facility] of [facility] [address], [place] [phone]) [phone] Accepts children up to age 44 who are enrolled in [place]icaid or [place] Health Choice; pregnant women with a Medicaid card; and children who have applied for Medicaid or [place] Health Choice, but were declined, whose parents can pay a reduced fee at time of service.  [facility] of [facility] [place]  [address], [place] [phone]) [phone] Accepts children up to age 44 who are enrolled in [place]icaid or [place] Health Choice; pregnant women with a Medicaid card; and children who have applied for Medicaid or [place] Health Choice, but were declined, whose parents can pay a reduced fee at time of service.  [facility]  [address], [place] [phone]) [phone] Patients are seen by appointment only. Walk-ins are not accepted. [facility] will see patients 44 years of age and older. Monday - Tuesday (8am-5pm) Most Wednesdays (8:30-5pm) $30 per visit, cash only  [facility]  [address], [facility] [phone] Patients are seen by appointment only. Walk-ins are not accepted. [facility] will see patients  118 years of age and older. One Wednesday Evening (Monthly: Volunteer Based).  $30 per visit, cash only  [employer] [facility]  [phone]) [phone] for adults; Children under age 174, call Graduate Pediatric Dentistry at [phone]) [phone]. Children aged 104-14, please call [phone]) [phone] to request a pediatric application.  Dental services are provided in all areas of dental care including  fillings, crowns and bridges, complete and partial dentures, implants, gum treatment, root canals, and extractions. Preventive care is also provided. Treatment is provided to both adults and children. Patients are selected via a lottery and there is often a waiting list.   [facility] [address], [place]  [phone] [email or web address]   [facility] [address], [place], [place] [phone], Ext. [phone] Second and Fourth Thursday of each month, opens at 6:30 AM; Clinic ends at 9 AM.  Patients are seen on a first-come first-served basis, and a limited number are seen during each clinic.   [facility]  [address], [place], [place] [phone]) [phone]   Eligibility Requirements You must have lived in [place], [place], or [place] counties for at least the last three months.   You cannot be eligible for state or federal sponsored National Cityhealthcare insurance, including CIGNAVeterans Administration, [place]icaid, or [employer]Medicare.   You generally cannot be eligible for healthcare insurance through your employer.    How to apply: Eligibility screenings are held every Tuesday and Wednesday afternoon from 1:00 pm until 4:00 pm. You do not need an appointment for the interview!  [facility] [address], [place], [place] [phone]   [facility]  [phone]   [facility]  [phone]   [facility]  [phone]    Behavioral Health Resources in the Community: Intensive Outpatient  Programs Organization         Address  Phone  Notes  [facility] [address], [place], [place] [phone]   [facility] [address], [place], [place] [phone]   [facility] [address], [place], [place]  [phone]   [facility] [address],  [place], [place] [phone] or [phone]   Substance Abuse Resources Organization         Address  Phone  Notes  [facility]  [phone]   [facility]  [phone]   [facility]  [phone]   [facility]  [phone]   Residential & Outpatient Substance Abuse Program  [phone]   Psychological Services Organization         Address  Phone  Notes  [facility]  [phone]- [phone]   [facility]  [phone]- [phone]   [facility] [address], [place] [phone] or [phone]    Mobile Crisis Teams Organization         Address  Phone  Notes  [facility], Mobile Crisis Care Unit  [phone]   [facility]  [address] [place], [place] [phone]   Doristine LocksSharon DeEsch [address] [place] [phone]    Self-Help/Support Groups Organization         Address  Phone             Notes  [facility]oc[facility] of [place] - variety of support groups  336- [phone] Call for more information  Narcotics Anonymous (NA), Caring [facility] [address], [place]   2 meetings at this location   Statisticianesidential Treatment Programs Organization         Address  Phone  Notes  [facility] [address],    [place]  [phone]   [facility]  [address], [place] [id number], [place], [place] [phone]   [facility] [address], [place] [phone] Admissions: 8am-3pm M-F  [facility] [address]    [place], [place]  [phone]   [facility] [address], [place], [place] [phone]   [facility] [address]  [place], [place] [phone]   [facility] - Intensive Outpatient [address], [place], [place] [phone]   [facility] ([facility] [address]  [place], [place] [phone] or [phone]   [facility] ([facility]) [address] [place], [place] [phone] Accepts Medicaid  [facility] [address]  [place] [phone] Substance Abuse/Addiction Treatment   [facility] Behavioral Health Resources Organization         Address  Phone  Notes  [facility]  [phone]   Angie FavaJulie Brannon, PhD [address], [address] [place], [place]   [phone]) [phone] or [phone](336) [phone]   [facility]   [address] [place], [place] [phone]   [facility] [address], [place], [place] [phone]) [phone] Insurance/Medicaid/sponsorship through [facility]  [facility] [address]                                    [place], [place] [phone]) [phone] Therapy/tele-psych/case  [facility] [address].   [place], [place] [phone](336) [phone]    Dr. Lolly MustacheArfeen  [phone]   [facility] [place]  [employer] [facility] 1) [address], [place] 2) [address], [place] 3)  [address], [place] [phone]) [phone]  [phone]) [phone]   [facility] Child Abuse Hotline [phone]) [phone] or [phone](336) [phone] (After Hours)

## 2014-12-04 NOTE — ED Notes (Signed)
Pt waiting for CT. 

## 2014-12-04 NOTE — ED Notes (Signed)
Abd pain x 1 week-nausea-denies v/d-states pain feels same as pain with diverticulitis

## 2014-12-04 NOTE — ED Notes (Signed)
Pt returned from CT °

## 2017-04-01 ENCOUNTER — Emergency Department (HOSPITAL_BASED_OUTPATIENT_CLINIC_OR_DEPARTMENT_OTHER)
Admission: EM | Admit: 2017-04-01 | Discharge: 2017-04-01 | Disposition: A | Discharge status: Home / Self Care | Payer: No Typology Code available for payment source | Attending: Emergency Medicine | Admitting: Emergency Medicine

## 2017-04-01 ENCOUNTER — Emergency Department (HOSPITAL_BASED_OUTPATIENT_CLINIC_OR_DEPARTMENT_OTHER): Payer: No Typology Code available for payment source

## 2017-04-01 ENCOUNTER — Encounter (HOSPITAL_BASED_OUTPATIENT_CLINIC_OR_DEPARTMENT_OTHER): Payer: Self-pay

## 2017-04-01 DIAGNOSIS — Y939 Activity, unspecified: Secondary | ICD-10-CM | POA: Insufficient documentation

## 2017-04-01 DIAGNOSIS — Z7984 Long term (current) use of oral hypoglycemic drugs: Secondary | ICD-10-CM | POA: Diagnosis not present

## 2017-04-01 DIAGNOSIS — S161XXA Strain of muscle, fascia and tendon at neck level, initial encounter: Secondary | ICD-10-CM

## 2017-04-01 DIAGNOSIS — Y998 Other external cause status: Secondary | ICD-10-CM | POA: Insufficient documentation

## 2017-04-01 DIAGNOSIS — M542 Cervicalgia: Secondary | ICD-10-CM | POA: Diagnosis present

## 2017-04-01 DIAGNOSIS — Z79899 Other long term (current) drug therapy: Secondary | ICD-10-CM | POA: Insufficient documentation

## 2017-04-01 DIAGNOSIS — I10 Essential (primary) hypertension: Secondary | ICD-10-CM | POA: Diagnosis not present

## 2017-04-01 DIAGNOSIS — Y92410 Unspecified street and highway as the place of occurrence of the external cause: Secondary | ICD-10-CM | POA: Insufficient documentation

## 2017-04-01 DIAGNOSIS — E119 Type 2 diabetes mellitus without complications: Secondary | ICD-10-CM | POA: Diagnosis not present

## 2017-04-01 MED ORDER — NAPROXEN 375 MG PO TABS: 375.0000 mg | ORAL_TABLET | Freq: Two times a day (BID) | ORAL | 0 refills | Status: AC

## 2017-04-01 MED ORDER — DIAZEPAM 5 MG PO TABS: 5.0000 mg | ORAL_TABLET | Freq: Two times a day (BID) | ORAL | 0 refills | Status: AC

## 2017-04-01 MED ORDER — NAPROXEN 250 MG PO TABS
375.0000 mg | ORAL_TABLET | Freq: Once | ORAL | Status: AC
  Administered 2017-04-01: 375 mg via ORAL
  Filled 2017-04-01: qty 2

## 2017-04-01 NOTE — ED Notes (Signed)
ED Provider at bedside discussing test results and dispo plan of care. 

## 2017-04-01 NOTE — ED Notes (Signed)
Patient in Xray
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
## 2017-04-01 NOTE — Discharge Instructions (Signed)
Your imaging has been reassuring. This is likely a muscle spasm called torticollis.  Please the the valium for muscle relaxation. This medication will make you drowsy so avoid situation that could place you in danger.   Please take the Naproxen as prescribed for pain. Do not take any additional NSAIDs including Motrin, Aleve, Ibuprofen, Advil.  Please follow-up with the orthopedic doctor or your primary care doctor if symptoms are not improving.
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
## 2023-11-13 ENCOUNTER — Emergency Department (HOSPITAL_BASED_OUTPATIENT_CLINIC_OR_DEPARTMENT_OTHER)
Admission: EM | Admit: 2023-11-13 | Discharge: 2023-11-13 | Disposition: A | Discharge status: Home / Self Care | Attending: Emergency Medicine | Admitting: Emergency Medicine

## 2023-11-13 ENCOUNTER — Emergency Department (HOSPITAL_BASED_OUTPATIENT_CLINIC_OR_DEPARTMENT_OTHER)

## 2023-11-13 ENCOUNTER — Encounter (HOSPITAL_BASED_OUTPATIENT_CLINIC_OR_DEPARTMENT_OTHER): Payer: Self-pay | Admitting: Emergency Medicine

## 2023-11-13 DIAGNOSIS — S80211A Abrasion, right knee, initial encounter: Secondary | ICD-10-CM

## 2023-11-13 DIAGNOSIS — I10 Essential (primary) hypertension: Secondary | ICD-10-CM

## 2023-11-13 DIAGNOSIS — S8002XA Contusion of left knee, initial encounter: Secondary | ICD-10-CM | POA: Diagnosis not present

## 2023-11-13 DIAGNOSIS — W010XXA Fall on same level from slipping, tripping and stumbling without subsequent striking against object, initial encounter: Secondary | ICD-10-CM

## 2023-11-13 DIAGNOSIS — Z7984 Long term (current) use of oral hypoglycemic drugs: Secondary | ICD-10-CM | POA: Insufficient documentation

## 2023-11-13 DIAGNOSIS — I2510 Atherosclerotic heart disease of native coronary artery without angina pectoris: Secondary | ICD-10-CM | POA: Insufficient documentation

## 2023-11-13 DIAGNOSIS — S0083XA Contusion of other part of head, initial encounter: Secondary | ICD-10-CM

## 2023-11-13 DIAGNOSIS — Z79899 Other long term (current) drug therapy: Secondary | ICD-10-CM | POA: Insufficient documentation

## 2023-11-13 DIAGNOSIS — E119 Type 2 diabetes mellitus without complications: Secondary | ICD-10-CM | POA: Insufficient documentation

## 2023-11-13 DIAGNOSIS — S0990XA Unspecified injury of head, initial encounter: Secondary | ICD-10-CM | POA: Diagnosis present

## 2023-11-13 HISTORY — DX: Atherosclerotic heart disease of native coronary artery without angina pectoris: I25.10

## 2023-11-13 MED ORDER — BACITRACIN ZINC 500 UNIT/GM EX OINT
TOPICAL_OINTMENT | Freq: Two times a day (BID) | CUTANEOUS | Status: DC
  Administered 2023-11-13: 31.5 via TOPICAL
  Filled 2023-11-13: qty 28.35

## 2023-11-13 NOTE — ED Notes (Signed)
 Pt care taken, has had all x-rays and his ct scan.

## 2023-11-13 NOTE — Discharge Instructions (Addendum)
 Apply ice to sore areas.  Ice should be applied for 30 minutes at a time, 4 times a day.  You may take acetaminophen, ibuprofen, or naproxen as needed for pain.  To get additional pain relief, you can combine either acetaminophen plus ibuprofen, or acetaminophen plus naproxen.

## 2023-11-13 NOTE — ED Provider Notes (Signed)
 [place] [facility] DEPARTMENT AT [facility] Provider Note   CSN: [id number] Arrival date & time: 11/13/23  1946     History  Chief Complaint  Patient presents with   Alejandro Patton is a 53 y.o. male.  The history is provided by the patient.  Fall  He has history of hypertension, diabetes, hyperlipidemia, coronary artery disease and comes in after tripping over some uneven concrete and hitting his forehead and both knees on the ground.  He suffered an abrasion to his right knee.  He denies loss of consciousness.   Home Medications Prior to Admission medications   Medication Sig Start Date End Date Taking? Authorizing Provider  diazepam (VALIUM) 5 MG tablet Take 1 tablet (5 mg total) by mouth 2 (two) times daily. 04/01/17   Rise Mu, PA-C  esomeprazole (NEXIUM) 40 MG capsule Take 40 mg by mouth daily before breakfast.      [provider]  LOSARTAN POTASSIUM-HCTZ PO Take by mouth.    [provider]  lovastatin (MEVACOR) 40 MG tablet Take 40 mg by mouth at bedtime.    [provider]  metFORMIN (GLUCOPHAGE) 500 MG tablet Take 500 mg by mouth daily.    [provider]  naproxen (NAPROSYN) 375 MG tablet Take 1 tablet (375 mg total) by mouth 2 (two) times daily. 04/01/17   Rise Mu, PA-C      Allergies    Patient has no known allergies.    Review of Systems   Review of Systems  All other systems reviewed and are negative.   Physical Exam Updated Vital Signs BP (!) 142/114   Pulse 100   Temp 98.2 F (36.8 C) (Oral)   Resp 18   Ht 6' (1.829 m)   Wt 125.2 kg   SpO2 100%   BMI 37.43 kg/m  Physical Exam Vitals and nursing note reviewed.   53 year old male, resting comfortably and in no acute distress. Vital signs are significant for elevated blood pressure. Oxygen saturation is 100%, which is normal. Head is normocephalic.  Small hematoma present in the right supraorbital area. PERRLA,  EOMI. Oropharynx is clear. Neck is nontender. Back is nontender. Lungs are clear without rales, wheezes, or rhonchi. Chest is nontender. Heart has regular rate and rhythm without murmur. Abdomen is soft, flat, nontender. Extremities: Minor abrasion is present on the anterior aspect of the right knee.  There is no effusion of the right knee or left knee, no instability on valgus or varus stress.  There is tenderness to palpation of both knees.  There is no swelling.  Full range of motion is present in all other joints except for the left shoulder which is status post rotator cuff surgery. Skin is warm and dry without rash. Neurologic: Mental status is normal, cranial nerves are intact, moves all extremities equally.  ED Results / Procedures / Treatments    Radiology DG Knee Complete 4 Views Right Result Date: 11/13/2023 CLINICAL DATA:  Pain after fall. EXAM: RIGHT KNEE - COMPLETE 4+ VIEW COMPARISON:  None Available. FINDINGS: No fracture or dislocation. Mild tricompartmental peripheral spurring. Small quadriceps tendon enthesophyte. Moderate patellar tendon enthesophyte. No significant joint effusion. Mild soft tissue edema. IMPRESSION: 1. Mild soft tissue edema. No fracture or dislocation. 2. Mild tricompartmental osteoarthritis. Electronically Signed   By: Narda Rutherford M.D.   On: 11/13/2023 20:42   DG Knee Complete 4 Views Left Result Date: 11/13/2023 CLINICAL DATA:  Trip and fall.  Bilateral knee pain. EXAM: LEFT KNEE - COMPLETE 4+ VIEW COMPARISON:  None Available. FINDINGS: No fracture or dislocation. There is mild tricompartmental peripheral spurring and spurring of the tibial spines. Small quadriceps and patellar tendon enthesophyte. Chronic fragmentation with anterior tibial tubercle. No significant joint effusion. Mild soft tissue edema is seen. IMPRESSION: 1. Mild soft tissue edema. No fracture or dislocation. 2. Mild tricompartmental osteoarthritis. Electronically Signed   By: Narda Rutherford M.D.   On: 11/13/2023 20:41   CT Head Wo Contrast Result Date: 11/13/2023 CLINICAL DATA:  Head trauma, moderate-severe Trip and fall onto a wooden fence at home. EXAM: CT HEAD WITHOUT CONTRAST TECHNIQUE: Contiguous axial images were obtained from the base of the skull through the vertex without intravenous contrast. RADIATION DOSE REDUCTION: This exam was performed according to the departmental dose-optimization program which includes automated exposure control, adjustment of the mA and/or kV according to patient size and/or use of iterative reconstruction technique. COMPARISON:  None Available. FINDINGS: Brain: No intracranial hemorrhage, mass effect, or midline shift. No hydrocephalus. The basilar cisterns are patent. No evidence of territorial infarct or acute ischemia. No extra-axial or intracranial fluid collection. Vascular: No hyperdense vessel or unexpected calcification. Skull: No fracture or focal lesion. Sinuses/Orbits: No acute finding. Other: No confluent scalp hematoma. IMPRESSION: No acute intracranial abnormality. No skull fracture. Electronically Signed   By: Narda Rutherford M.D.   On: 11/13/2023 20:40    Procedures Procedures    Medications Ordered in ED Medications  bacitracin ointment (has no administration in time range)    ED Course/ Medical Decision Making/ A&P                                 Medical Decision Making  Fall with injury to both knees and forehead.  CT scan of head shows no acute injury.  X-rays of both knees show evidence of arthritis but no fracture or dislocation.  Have independently viewed all of the images, and agree with the radiologist's interpretation.  I have advised him on applying ice and using over-the-counter NSAIDs and acetaminophen as needed for pain.  Final Clinical Impression(s) / ED Diagnoses Final diagnoses:  Fall from slip, trip, or stumble, initial encounter  Contusion of forehead, initial encounter  Abrasion, right knee,  initial encounter  Contusion of left knee, initial encounter  Elevated blood pressure reading with diagnosis of hypertension    Rx / DC Orders ED Discharge Orders     None         Dione Booze, MD 11/13/23 2314

## 2023-11-13 NOTE — ED Triage Notes (Signed)
 Pt tripped, fell into wooden fence at home; c/o pain to forehead and bil knees; not on blood thinners; no LOC
